# Patient Record
Sex: Male | Born: 1960 | Race: White | Hispanic: No | Marital: Married | Smoking: Never smoker
Health system: Southern US, Community
[De-identification: ages and names within clinical notes are randomized; demographics above are authoritative.]

---

## 2002-05-10 ENCOUNTER — Ambulatory Visit (HOSPITAL_COMMUNITY): Admission: RE | Admit: 2002-05-10 | Discharge: 2002-05-10 | Payer: Self-pay | Admitting: Family Medicine

## 2002-05-10 ENCOUNTER — Encounter: Payer: Self-pay | Admitting: Family Medicine

## 2020-03-28 ENCOUNTER — Encounter: Payer: Self-pay | Admitting: Plastic Surgery

## 2020-03-28 ENCOUNTER — Other Ambulatory Visit: Payer: Self-pay

## 2020-03-28 ENCOUNTER — Ambulatory Visit: Payer: BC Managed Care – PPO | Admitting: Plastic Surgery

## 2020-03-28 VITALS — BP 122/73 | HR 68 | Temp 98.2°F | Ht 68.0 in | Wt 233.8 lb

## 2020-03-28 DIAGNOSIS — L989 Disorder of the skin and subcutaneous tissue, unspecified: Secondary | ICD-10-CM | POA: Diagnosis not present

## 2020-03-28 NOTE — Progress Notes (Signed)
   Referring Provider No referring provider defined for this encounter.   CC:  Chief Complaint  Patient presents with  . Consult      Hector Carpenter is an 59 y.o. male.  HPI: Patient presents to discuss multiple lesions on his bilateral forearms.  He has had a couple skin cancers removed in his left forearm before and he believes negative margins were obtained.  We are working on obtaining those records.  Otherwise he kayaks a lot and is out in the sun and he has a few other lesions on his forearms that he would like to be evaluated.  None of them are painful or changing significantly but he is just interested in getting them checked.  No Known Allergies  Outpatient Encounter Medications as of 03/28/2020  Medication Sig  . losartan (COZAAR) 25 MG tablet Take by mouth.  . metoprolol succinate (TOPROL-XL) 100 MG 24 hr tablet Take 2 tablets by mouth daily.   No facility-administered encounter medications on file as of 03/28/2020.     No past medical history on file.  No family history on file.  Social History   Social History Narrative  . Not on file     Review of Systems General: Denies fevers, chills, weight loss CV: Denies chest pain, shortness of breath, palpitations  Physical Exam Vitals with BMI 03/28/2020  Height 5\' 8"   Weight 233 lbs 13 oz  BMI 35.56  Systolic 122  Diastolic 73  Pulse 68    General:  No acute distress,  Alert and oriented, Non-Toxic, Normal speech and affect Examination of the right forearm shows a few 4 to 5 mm scaly papules with well-defined borders and consistent pigmentation.  He has another by the right elbow that is darker in pigmentation but otherwise similar to the other lesions. Examination of the left forearm shows some similar areas of scaly papules.  There is one on the dorsal distal forearm that has a little bit of erythema to it.  There is no open ulceration and they all have fairly well-defined borders.  Assessment/Plan Patient  presents with several lesions of the bilateral forearm.  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  A few of them may be squamous cell in situ.  We discussed biopsy and excision versus consultation with dermatology for evaluation for topical treatments.  At the moment the patient prefers to check with his dermatologist to see if less invasive measures can be utilized and I think that is reasonable at this point.  If he notices any significant change in any of these lesions I am happy to biopsy them and or excise them at any time.  All of his questions were answered and we will plan to see him again on an as-needed basis.  03/28/2020, 4:14 PM

## 2020-05-08 ENCOUNTER — Encounter: Payer: Self-pay | Admitting: Emergency Medicine

## 2020-05-08 ENCOUNTER — Emergency Department
Admission: EM | Admit: 2020-05-08 | Discharge: 2020-05-08 | Disposition: A | Discharge status: Home / Self Care | Payer: Worker's Compensation | Attending: Emergency Medicine | Admitting: Emergency Medicine

## 2020-05-08 ENCOUNTER — Emergency Department: Payer: Worker's Compensation

## 2020-05-08 ENCOUNTER — Other Ambulatory Visit: Payer: Self-pay

## 2020-05-08 DIAGNOSIS — U071 COVID-19: Secondary | ICD-10-CM | POA: Diagnosis not present

## 2020-05-08 DIAGNOSIS — R55 Syncope and collapse: Secondary | ICD-10-CM

## 2020-05-08 LAB — CBC
HCT: 41.7 % (ref 39.0–52.0)
Hemoglobin: 14.2 g/dL (ref 13.0–17.0)
MCH: 28.9 pg (ref 26.0–34.0)
MCHC: 34.1 g/dL (ref 30.0–36.0)
MCV: 84.9 fL (ref 80.0–100.0)
Platelets: 200 10*3/uL (ref 150–400)
RBC: 4.91 MIL/uL (ref 4.22–5.81)
RDW: 12.8 % (ref 11.5–15.5)
WBC: 5 10*3/uL (ref 4.0–10.5)
nRBC: 0 % (ref 0.0–0.2)

## 2020-05-08 LAB — BASIC METABOLIC PANEL
Anion gap: 11 (ref 5–15)
BUN: 20 mg/dL (ref 6–20)
CO2: 25 mmol/L (ref 22–32)
Calcium: 8.8 mg/dL — ABNORMAL LOW (ref 8.9–10.3)
Chloride: 96 mmol/L — ABNORMAL LOW (ref 98–111)
Creatinine, Ser: 1.31 mg/dL — ABNORMAL HIGH (ref 0.61–1.24)
GFR, Estimated: >60 mL/min (ref >60)
Glucose, Bld: 112 mg/dL — ABNORMAL HIGH (ref 70–99)
Potassium: 4.1 mmol/L (ref 3.5–5.1)
Sodium: 132 mmol/L — ABNORMAL LOW (ref 135–145)

## 2020-05-08 LAB — TROPONIN I (HIGH SENSITIVITY): Troponin I (High Sensitivity): 5 ng/L (ref <18)

## 2020-05-08 LAB — CBG MONITORING, ED: Glucose-Capillary: 98 mg/dL (ref 70–99)

## 2020-05-08 MED ORDER — ONDANSETRON HCL 4 MG PO TABS: 4.0000 mg | ORAL_TABLET | Freq: Every day | ORAL | 0 refills | Status: AC | PRN

## 2020-05-08 MED ORDER — GUAIFENESIN-CODEINE 100-10 MG/5ML PO SOLN
5.0000 mL | Freq: Four times a day (QID) | ORAL | 0 refills | Status: AC | PRN
Start: 2020-05-08 — End: ?

## 2020-05-08 MED ORDER — SODIUM CHLORIDE 0.9 % IV BOLUS
1000.0000 mL | Freq: Once | INTRAVENOUS | Status: AC
  Administered 2020-05-08: 14:00:00 1000 mL via INTRAVENOUS

## 2020-05-08 MED ORDER — ONDANSETRON HCL 4 MG/2ML IJ SOLN
4.0000 mg | Freq: Once | INTRAMUSCULAR | Status: AC
  Administered 2020-05-08: 15:00:00 4 mg via INTRAVENOUS
  Filled 2020-05-08: qty 2

## 2020-05-08 MED ORDER — PREDNISONE 10 MG PO TABS: 10.0000 mg | ORAL_TABLET | Freq: Every day | ORAL | 0 refills | Status: AC

## 2020-05-08 NOTE — ED Notes (Signed)
Pt states he has been sickly for a few days, and has had decreased intake of solid food.
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
## 2020-05-08 NOTE — ED Notes (Signed)
Pt placed in room - not placed on monitor by NA
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
## 2020-05-08 NOTE — ED Notes (Signed)
Patient transported to X-ray 

## 2021-11-19 IMAGING — CR DG CHEST 2V
2 series · 2 of 2 positions shown · non-contrast
Comparison: None.

CLINICAL DATA: Cough, syncope

EXAM:
CHEST - 2 VIEW

[chest pa]
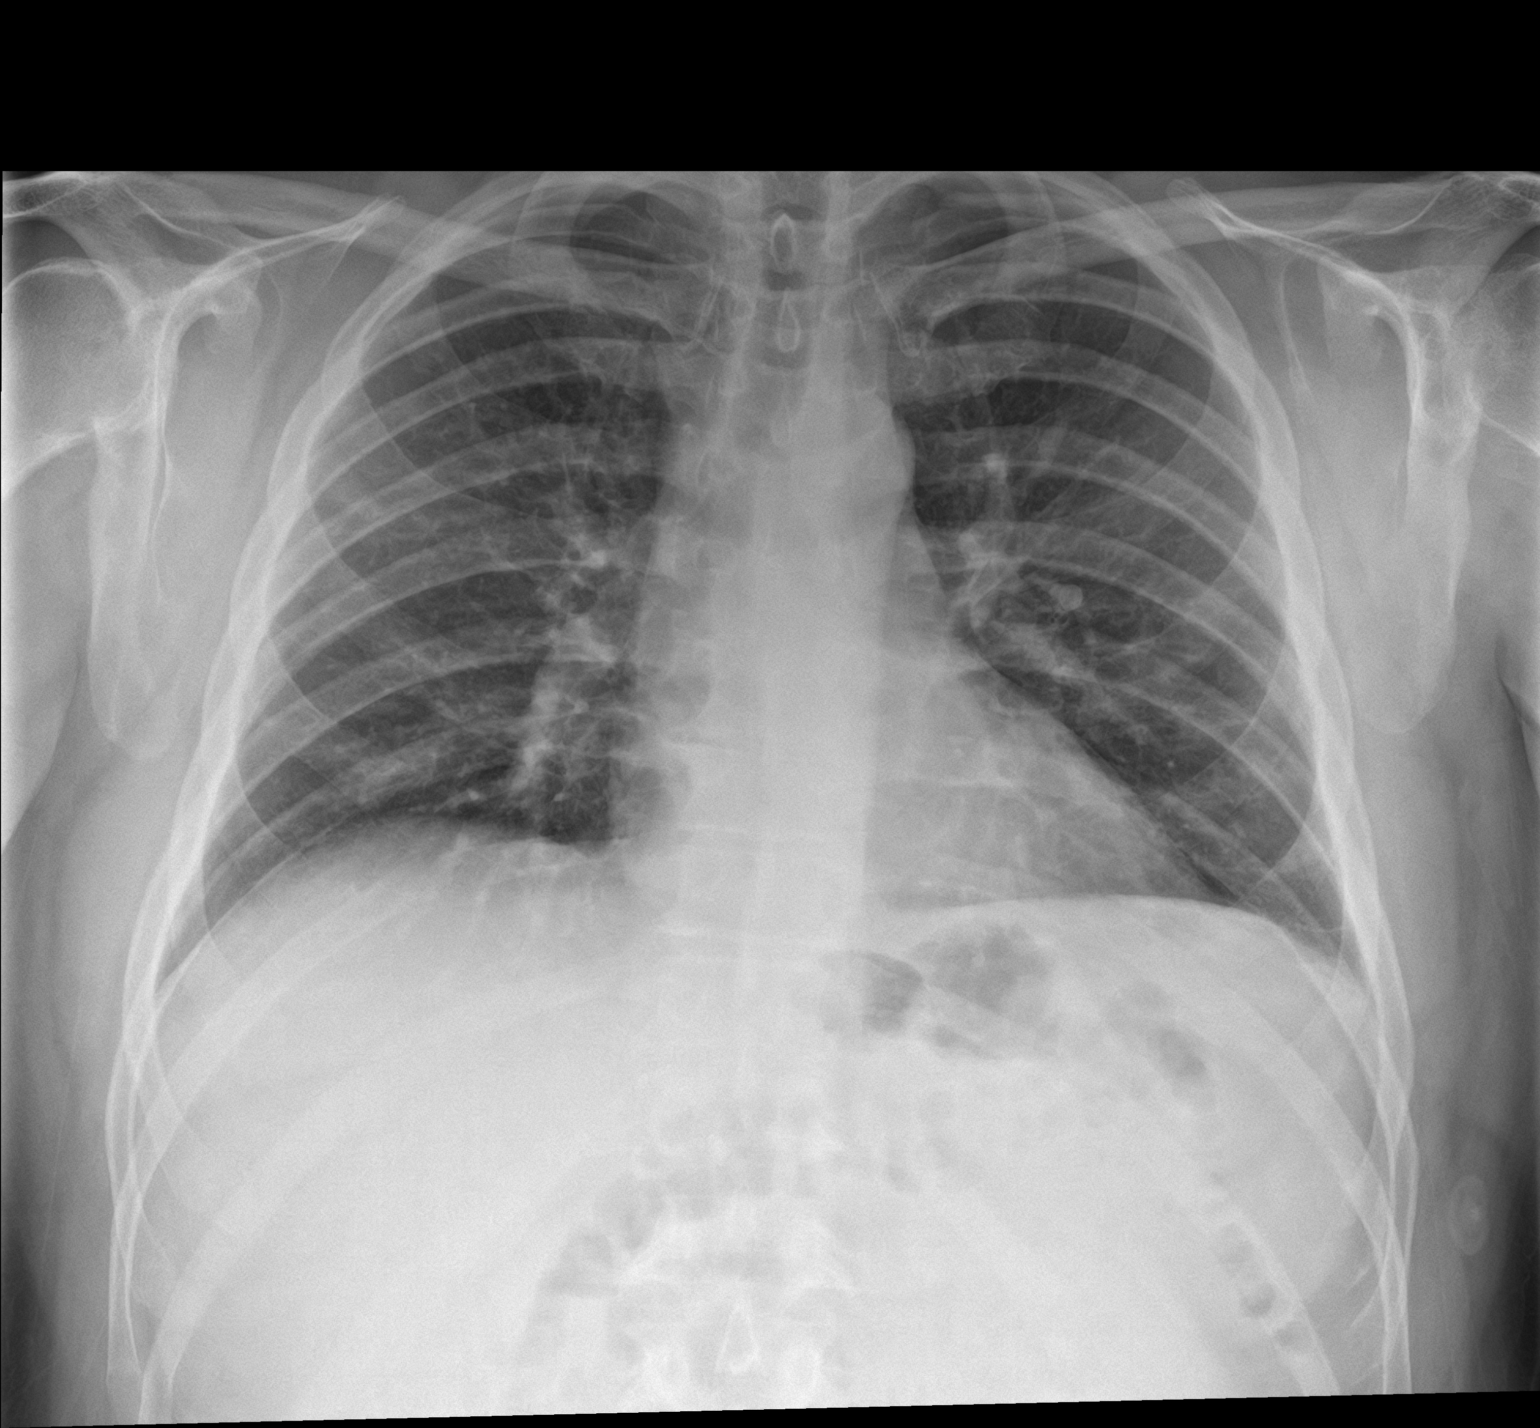

[chest lat]
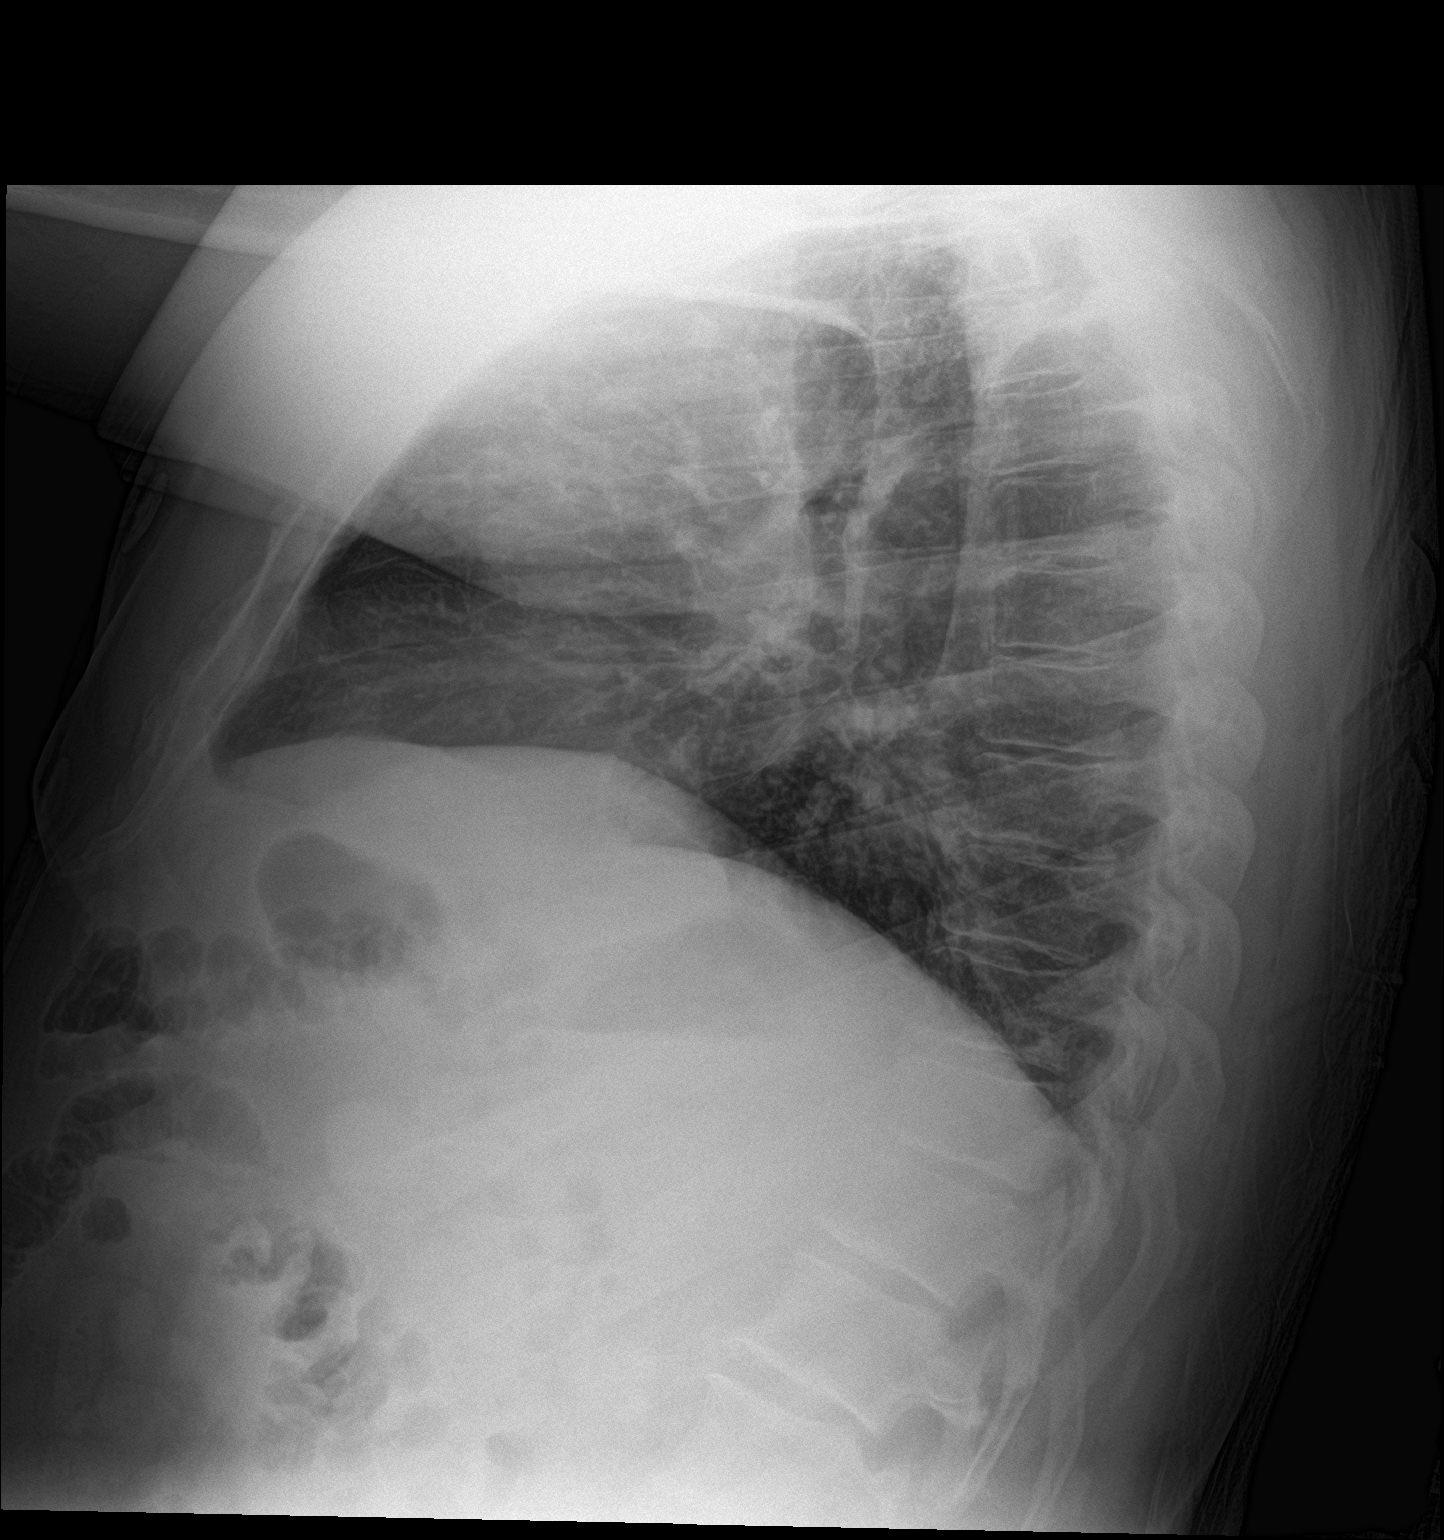

[2 of 2 positions shown; findings below may reference images not displayed]

FINDINGS: Patchy areas of airspace disease bilaterally. No pleural effusion or
pneumothorax. Heart and mediastinal contours are unremarkable.

No acute osseous abnormality.
IMPRESSION: Patchy areas of airspace disease bilaterally concerning for
pneumonia including atypical viral pneumonia.
